# Patient Record
Sex: Male | Born: 1935 | Race: White | Hispanic: No | Marital: Married
Health system: Southern US, Community
[De-identification: ages and names within clinical notes are randomized; demographics above are authoritative.]

---

## 2004-04-06 ENCOUNTER — Ambulatory Visit: Payer: Self-pay | Admitting: Unknown Physician Specialty

## 2004-06-05 ENCOUNTER — Ambulatory Visit: Payer: Self-pay

## 2005-03-11 ENCOUNTER — Ambulatory Visit: Payer: Self-pay | Admitting: Internal Medicine

## 2005-06-13 ENCOUNTER — Ambulatory Visit: Payer: Self-pay | Admitting: Internal Medicine

## 2006-05-22 ENCOUNTER — Ambulatory Visit: Payer: Self-pay | Admitting: Internal Medicine

## 2006-06-06 ENCOUNTER — Ambulatory Visit: Payer: Self-pay | Admitting: Internal Medicine

## 2008-04-12 IMAGING — CT CT CHEST W/ CM
2 of 3 series · 15 of 30 positions shown, 18 images · non-contrast
Comparison: none

REASON FOR EXAM: pulmonary fibrosis with high resolution cuts
COMMENTS:

[Series 3: lung windows · axial · 0.75mm/px · z∈[-32,+208]mm · 9 of 62 slices shown, 12 images]
[im 7/62  mediastinal]
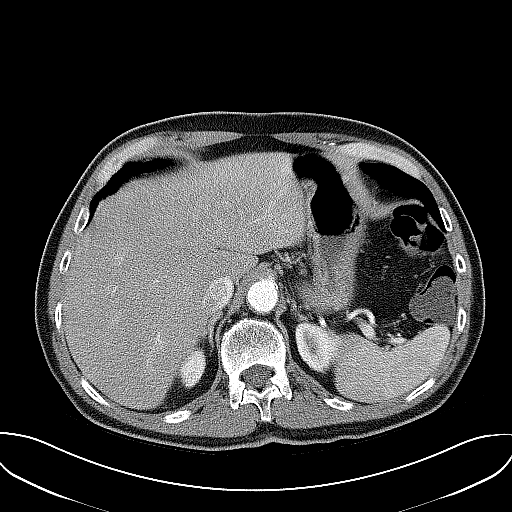
[im 7/62  lung]
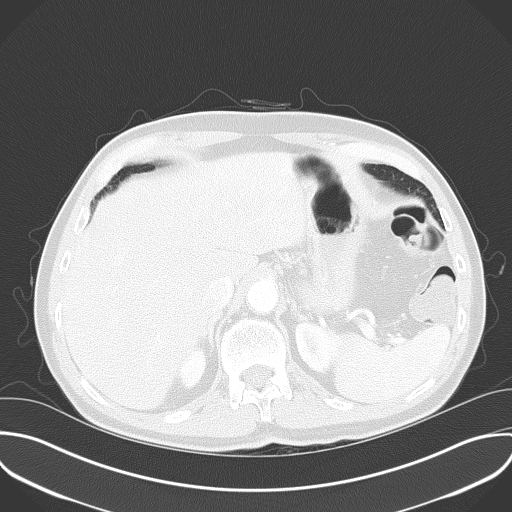
[im 14/62  lung]
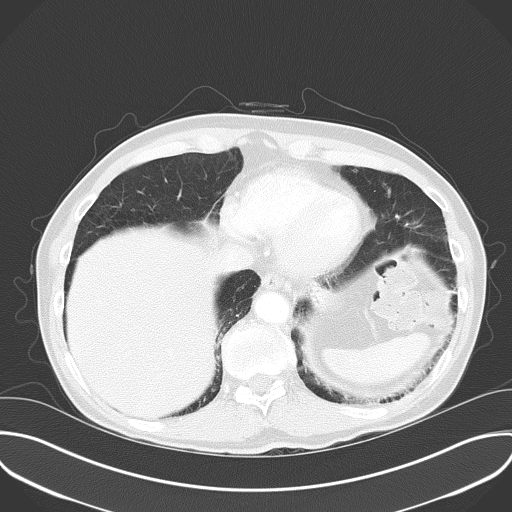
[im 21/62  lung]
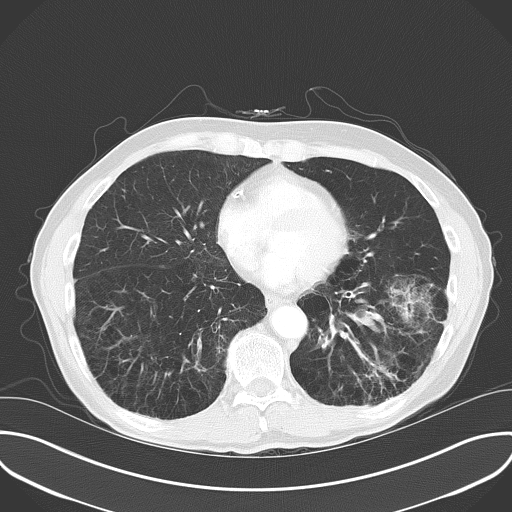
[im 28/62  lung]
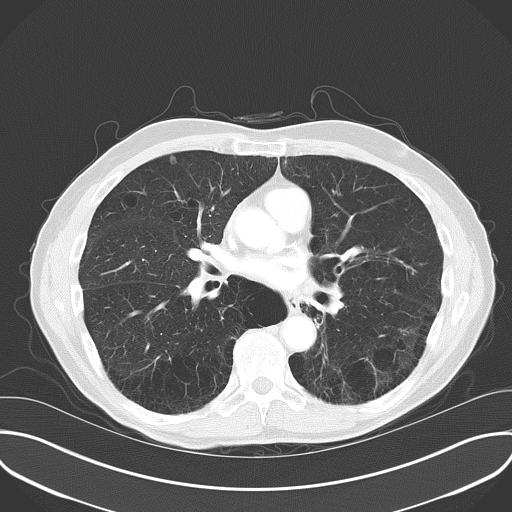
[im 30/62  mediastinal]
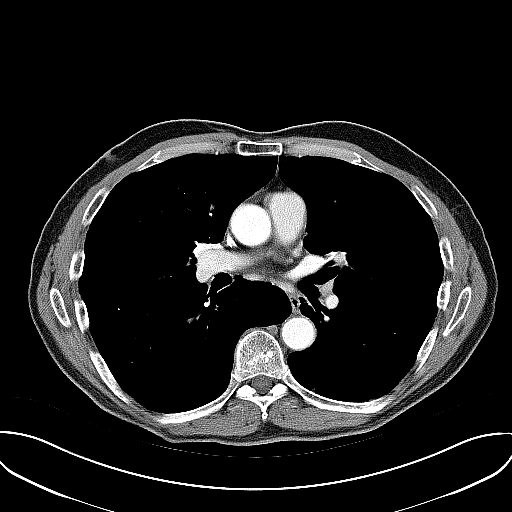
[im 30/62  lung]
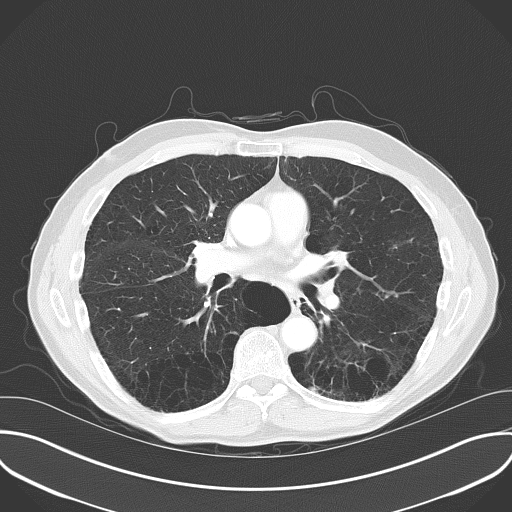
[im 34/62  lung]
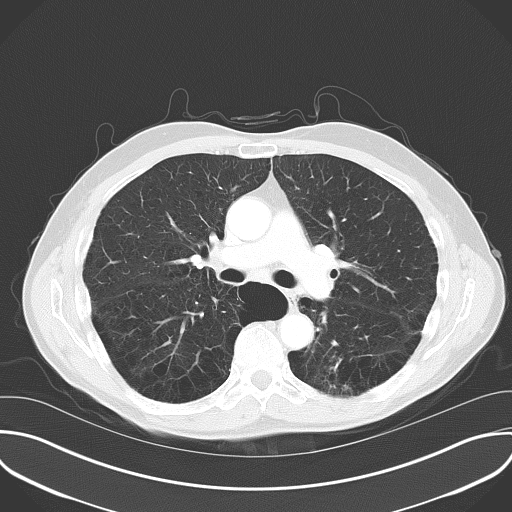
[im 41/62  lung]
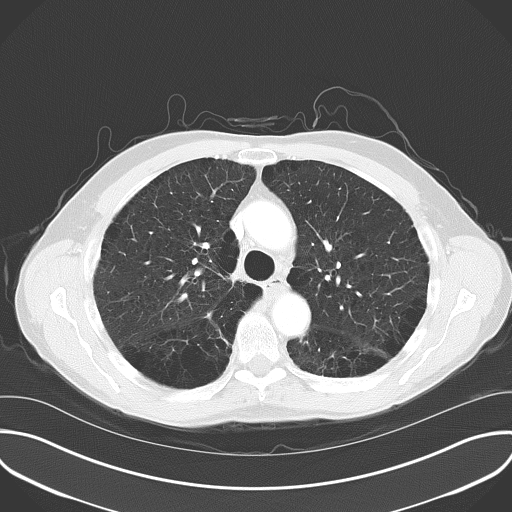
[im 48/62  lung]
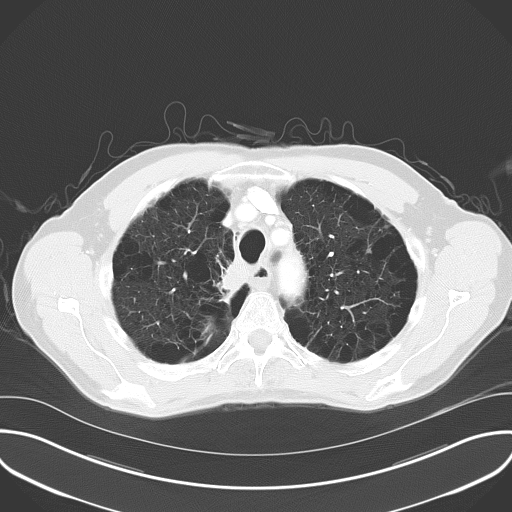
[im 55/62  mediastinal]
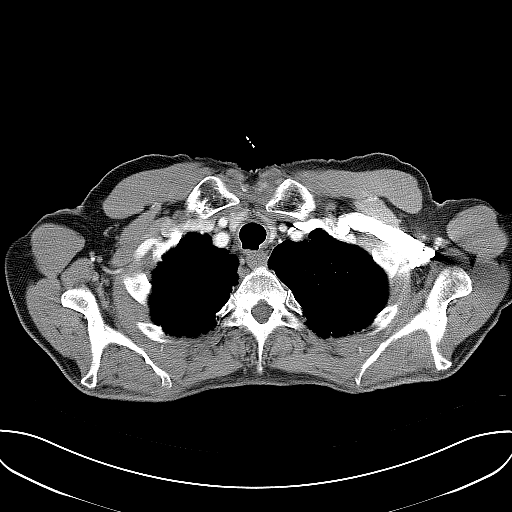
[im 55/62  lung]
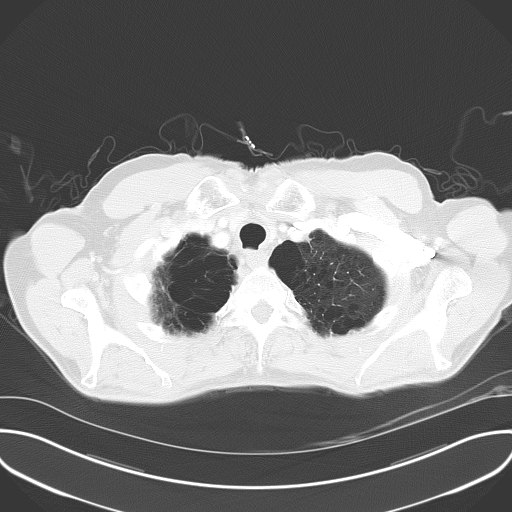

[Series 4: hi res · axial · 0.75mm/px · z∈[-15,+193]mm · 6 of 40 slices shown]
[im 7/40  lung]
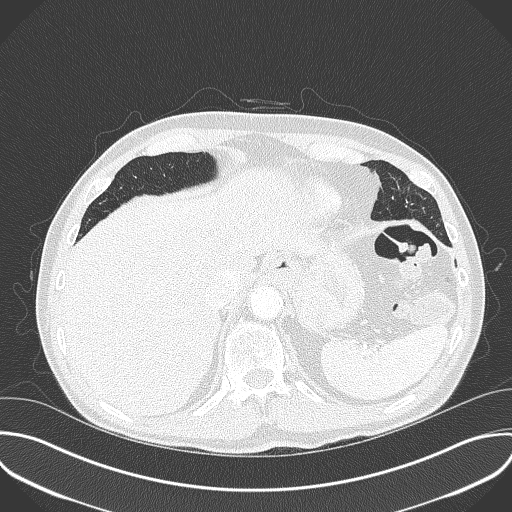
[im 14/40  lung]
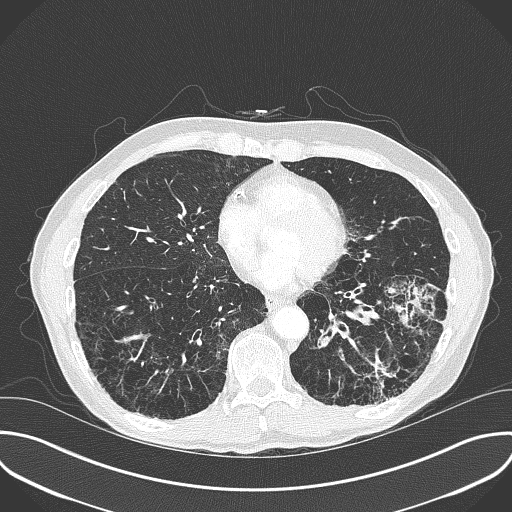
[im 19/40  lung]
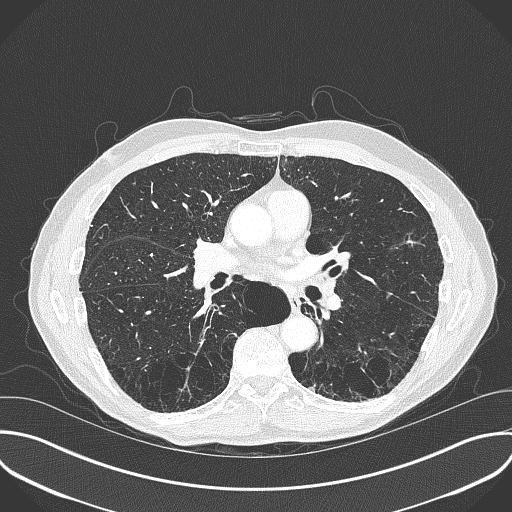
[im 20/40  lung]
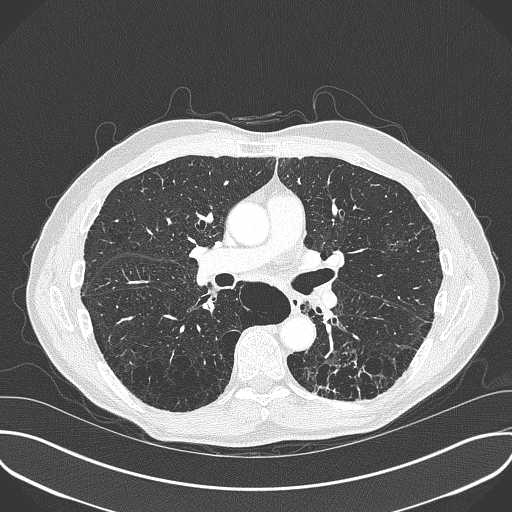
[im 27/40  lung]
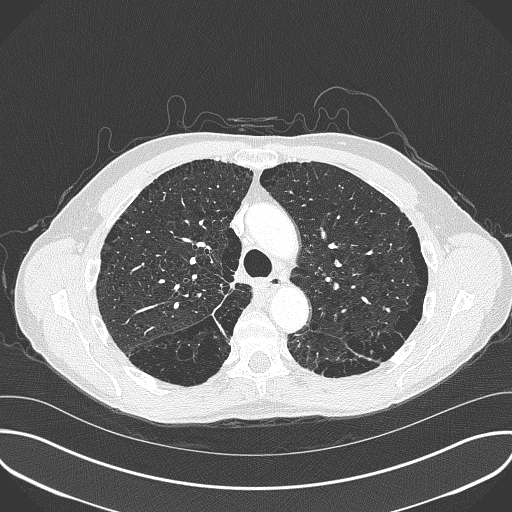
[im 33/40  lung]
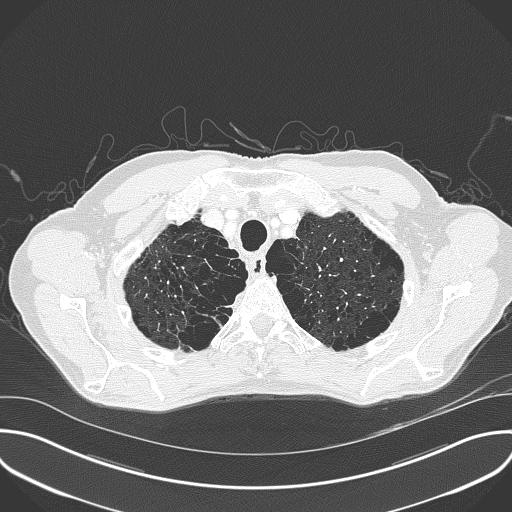

[15 of 30 positions shown; findings below may reference images not displayed]

PROCEDURE:     CT  - CT CHEST WITH CONTRAST  - May 22, 2006  [DATE]

RESULT:     The study was performed with supplemental high resolution
images.  The patient has a history of pulmonary fibrosis. Comparison is made
to a study dated 13 June, 2005.

The patient received 75 ml of Isovue 370.  There is abnormal soft tissue
density in the RIGHT paratracheal and paraesophageal region in the upper
hemithorax.  This is new.  It measures approximately 2.6 cm AP x 1.8 cm
transversely and extends inferiorly toward the suprahilar region.  There are
sub cm lymph nodes in the precarinal region. I do not see AP window
lymphadenopathy.  There is no subcarinal lymphadenopathy and I do not see
enlarged hilar lymph nodes. The cardiac chambers are normal in size.  There
is a small amount of fluid in the mid thoracic esophagus. I see no pleural
nor pericardial effusion. The caliber of the thoracic aorta is normal. The
cardiac chambers are normal in size.  At standard lung window settings there
are extensive emphysematous changes.  Large bullous lesions are present in
both upper lobes and smaller bullous lesions are seen in the lower lobes
bilaterally.  Patchy interstitial density is present in both lower lobes
that is slightly more conspicuous than on the prior study especially on the
LEFT.

The high resolution images reveal the abnormalities of the pulmonary
parenchyma more clearly with there being bronchiectasis in the LEFT lower
lobe as well as areas of patchy interstitial density. I do not see abnormal
nodules. The bullous lesions are well demonstrated as well.

Within the upper abdomen the observed portions of the liver are normal. I
see no adrenal masses.
IMPRESSION: 1)There is a new area of abnormal soft tissue density in the RIGHT
paratracheal and paraesophageal region seen best beginning on image #13 and
extending inferiorly to image #18.  This is worrisome for infection or for
malignancy.

2)There are extensive changes of bullous COPD with evidence of pulmonary
fibrosis especially in the lower lobes.  The LEFT lower lobe is most
significantly involved and there is evidence of bronchiectasis. I see no
abnormal pulmonary parenchymal mass.

3)No bulky hilar lymph nodes are seen and outside of the paratracheal soft
tissues to the RIGHT of midline I do not see significant abnormalities
within the mediastinum. There is a small amount of fluid within the mid
thoracic esophagus but this is not new.

4)Follow-up CT scanning and possibly PET scanning would be of value to
further evaluate the area of abnormality in the RIGHT
paratracheal-paraesophageal region.

## 2009-08-25 DEATH — deceased

## 2019-07-13 ENCOUNTER — Telehealth: Payer: Self-pay

## 2019-07-14 NOTE — Telephone Encounter (Signed)
error
